# Patient Record
Sex: Male | Born: 1985 | Race: White | Hispanic: No | Marital: Single | Smoking: Never smoker
Health system: Southern US, Community
[De-identification: ages and names within clinical notes are randomized; demographics above are authoritative.]

## PROBLEM LIST (undated history)

## (undated) HISTORY — PX: WRIST FUSION WITH ILIAC CREST BONE GRAFT: SHX5682

---

## 2019-04-17 ENCOUNTER — Other Ambulatory Visit: Payer: Self-pay

## 2019-04-17 DIAGNOSIS — Z20822 Contact with and (suspected) exposure to covid-19: Secondary | ICD-10-CM

## 2019-04-18 LAB — NOVEL CORONAVIRUS, NAA: SARS-CoV-2, NAA: NOT DETECTED

## 2019-07-30 ENCOUNTER — Ambulatory Visit
Admission: EM | Admit: 2019-07-30 | Discharge: 2019-07-30 | Disposition: A | Discharge status: Home / Self Care | Payer: Self-pay | Attending: Emergency Medicine | Admitting: Emergency Medicine

## 2019-07-30 ENCOUNTER — Ambulatory Visit (INDEPENDENT_AMBULATORY_CARE_PROVIDER_SITE_OTHER): Payer: Self-pay

## 2019-07-30 ENCOUNTER — Other Ambulatory Visit: Payer: Self-pay

## 2019-07-30 ENCOUNTER — Encounter: Payer: Self-pay | Admitting: Emergency Medicine

## 2019-07-30 DIAGNOSIS — S51011A Laceration without foreign body of right elbow, initial encounter: Secondary | ICD-10-CM

## 2019-07-30 DIAGNOSIS — Z23 Encounter for immunization: Secondary | ICD-10-CM

## 2019-07-30 DIAGNOSIS — W458XXA Other foreign body or object entering through skin, initial encounter: Secondary | ICD-10-CM

## 2019-07-30 DIAGNOSIS — M25521 Pain in right elbow: Secondary | ICD-10-CM

## 2019-07-30 MED ORDER — TETANUS-DIPHTH-ACELL PERTUSSIS 5-2.5-18.5 LF-MCG/0.5 IM SUSP
0.5000 mL | Freq: Once | INTRAMUSCULAR | Status: AC
  Administered 2019-07-30: 0.5 mL via INTRAMUSCULAR

## 2019-07-30 NOTE — ED Notes (Signed)
Patient able to ambulate independently  
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
## 2019-07-30 NOTE — ED Triage Notes (Addendum)
Pt presents to [facility] for assessment after cutting the back of his right arm (below the elbow) on a shard of broken ceramic.  Last tetanus unknown.  States blood "pours out" if he bends his arm.

## 2019-07-30 NOTE — Discharge Instructions (Addendum)
Wound clean and dry. May change dressings daily. Return to clinic in 3 to 4 days for wound recheck, 10 to 14 days for suture removal. Return sooner for worsening pain, redness, swelling, bleeding, fever.

## 2019-08-03 ENCOUNTER — Ambulatory Visit
Admission: EM | Admit: 2019-08-03 | Discharge: 2019-08-03 | Disposition: A | Discharge status: Home / Self Care | Payer: Self-pay

## 2019-08-03 ENCOUNTER — Telehealth: Payer: Self-pay | Admitting: Emergency Medicine

## 2019-08-03 NOTE — Telephone Encounter (Signed)
Patient called for update on elbow laceration.  Keeping clean and dry.  Minimal swelling around sutures.  No erythema, warmth, tenderness or discharge.  Denying fevers, painful range of motion.  Return precautions discussed, patient verbalized understanding and is agreeable to plan.

## 2020-08-03 ENCOUNTER — Encounter (HOSPITAL_COMMUNITY): Payer: Self-pay

## 2020-08-03 ENCOUNTER — Ambulatory Visit (HOSPITAL_COMMUNITY)
Admission: EM | Admit: 2020-08-03 | Discharge: 2020-08-03 | Disposition: A | Discharge status: Home / Self Care | Payer: Self-pay | Attending: Emergency Medicine | Admitting: Emergency Medicine

## 2020-08-03 ENCOUNTER — Other Ambulatory Visit: Payer: Self-pay

## 2020-08-03 DIAGNOSIS — J029 Acute pharyngitis, unspecified: Secondary | ICD-10-CM

## 2020-08-03 LAB — POCT RAPID STREP A, ED / UC: Streptococcus, Group A Screen (Direct): NEGATIVE

## 2020-08-03 NOTE — ED Triage Notes (Signed)
Pt reports swollen tonsils, chills and sore throat x 1 day.States when swallowing "feels like a bee sting". Pt reports white patches in the throat since this morning.  Reports he had a negative COVID test yesterday.

## 2020-08-03 NOTE — ED Provider Notes (Signed)
[facility]    CSN: [id number] Arrival date & time: 08/03/20  1010      History   Chief Complaint Chief Complaint  Patient presents with  . Sore Throat    HPI Trayden Brandy is a 35 y.o. male.   Eden Emms presents with complaints of sore throat which started yesterday. Feels fatigued as well, and "out of it."  Mild headache. No gi symptoms. Some post nasal drip which produces cough, otherwise no cough or congestion. No ear pain. No known ill contacts. Negative home covid testing. Noted swelling and white exudate to tonsils today.     ROS per HPI, negative if not otherwise mentioned.      History reviewed. No pertinent past medical history.  There are no problems to display for this patient.   Past Surgical History:  Procedure Laterality Date  . WRIST FUSION WITH ILIAC CREST BONE GRAFT Left        Home Medications    Prior to Admission medications   Not on File    Family History History reviewed. No pertinent family history.  Social History Social History   Tobacco Use  . Smoking status: Never Smoker  . Smokeless tobacco: Never Used  Substance Use Topics  . Alcohol use: Yes    Comment: once a week  . Drug use: Yes    Types: Marijuana     Allergies   Patient has no known allergies.   Review of Systems Review of Systems   Physical Exam Triage Vital Signs ED Triage Vitals  Enc Vitals Group     BP 08/03/20 1032 120/69     Pulse Rate 08/03/20 1032 74     Resp 08/03/20 1032 17     Temp 08/03/20 1032 98.9 F (37.2 C)     Temp Source 08/03/20 1032 Oral     SpO2 08/03/20 1032 96 %     Weight --      Height --      Head Circumference --      Peak Flow --      Pain Score 08/03/20 1030 8     Pain Loc --      Pain Edu? --      Excl. in GC? --    No data found.  Updated Vital Signs BP 120/69 (BP Location: Right Arm)   Pulse 74   Temp 98.9 F (37.2 C) (Oral)   Resp 17   SpO2 96%   Visual Acuity Right Eye Distance:    Left Eye Distance:   Bilateral Distance:    Right Eye Near:   Left Eye Near:    Bilateral Near:     Physical Exam Constitutional:      Appearance: He is well-developed.  HENT:     Mouth/Throat:     Tonsils: No tonsillar exudate. 1+ on the right. 1+ on the left.  Cardiovascular:     Rate and Rhythm: Normal rate.  Pulmonary:     Effort: Pulmonary effort is normal.  Lymphadenopathy:     Cervical: No cervical adenopathy.  Skin:    General: Skin is warm and dry.  Neurological:     Mental Status: He is alert and oriented to person, place, and time.      UC Treatments / Results  Labs (all labs ordered are listed, but only abnormal results are displayed) Labs Reviewed  CULTURE, GROUP A STREP [facility])  POCT RAPID STREP A, ED / UC    EKG  Radiology No results found.  Procedures Procedures (including critical care time)  Medications Ordered in UC Medications - No data to display  Initial Impression / Assessment and Plan / UC Course  I have reviewed the triage vital signs and the nursing notes.  Pertinent labs & imaging results that were available during my care of the patient were reviewed by me and considered in my medical decision making (see chart for details).     Negative rapid strep. Consistent with viral etiology at this time. Supportive cares recommended. Return precautions provided. Patient verbalized understanding and agreeable to plan.   Final Clinical Impressions(s) / UC Diagnoses   Final diagnoses:  Pharyngitis, unspecified etiology     Discharge Instructions     Negative for strep throat here today, I have sent your specimen to be cultured to confirm this.  Consistent with viral source of symptoms, which unfortunately cannot be treated with an antibiotic.  Throat lozenges, gargles, chloraseptic spray, warm teas, popsicles etc to help with throat pain.   Tylenol and/or ibuprofen as needed for pain or fevers.   If symptoms worsen or do not  improve in the next week to return to be seen or to follow up with your PCP.      ED Prescriptions    None     PDMP not reviewed this encounter.   Georgetta Haber, NP 08/03/20 1204

## 2020-08-03 NOTE — Discharge Instructions (Signed)
Negative for strep throat here today, I have sent your specimen to be cultured to confirm this.  Consistent with viral source of symptoms, which unfortunately cannot be treated with an antibiotic.  Throat lozenges, gargles, chloraseptic spray, warm teas, popsicles etc to help with throat pain.   Tylenol and/or ibuprofen as needed for pain or fevers.   If symptoms worsen or do not improve in the next week to return to be seen or to follow up with your PCP.

## 2020-08-05 LAB — CULTURE, GROUP A STREP (THRC)

## 2020-09-28 ENCOUNTER — Ambulatory Visit (HOSPITAL_COMMUNITY)
Admission: EM | Admit: 2020-09-28 | Discharge: 2020-09-29 | Disposition: A | Discharge status: Home / Self Care | Payer: No Payment, Other | Attending: Psychiatry | Admitting: Psychiatry

## 2020-09-28 ENCOUNTER — Other Ambulatory Visit: Payer: Self-pay

## 2020-09-28 DIAGNOSIS — Z20822 Contact with and (suspected) exposure to covid-19: Secondary | ICD-10-CM | POA: Diagnosis not present

## 2020-09-28 DIAGNOSIS — F39 Unspecified mood [affective] disorder: Secondary | ICD-10-CM | POA: Diagnosis not present

## 2020-09-28 LAB — COMPREHENSIVE METABOLIC PANEL
ALT: 24 U/L (ref 0–44)
AST: 17 U/L (ref 15–41)
Albumin: 4.3 g/dL (ref 3.5–5.0)
Alkaline Phosphatase: 46 U/L (ref 38–126)
Anion gap: 8 (ref 5–15)
BUN: 13 mg/dL (ref 6–20)
CO2: 28 mmol/L (ref 22–32)
Calcium: 9.6 mg/dL (ref 8.9–10.3)
Chloride: 102 mmol/L (ref 98–111)
Creatinine, Ser: 0.87 mg/dL (ref 0.61–1.24)
GFR, Estimated: >60 mL/min (ref >60)
Glucose, Bld: 75 mg/dL (ref 70–99)
Potassium: 5 mmol/L (ref 3.5–5.1)
Sodium: 138 mmol/L (ref 135–145)
Total Bilirubin: 0.9 mg/dL (ref 0.3–1.2)
Total Protein: 6.8 g/dL (ref 6.5–8.1)

## 2020-09-28 LAB — CBC WITH DIFFERENTIAL/PLATELET
Abs Immature Granulocytes: 0.02 10*3/uL (ref 0.00–0.07)
Basophils Absolute: 0 10*3/uL (ref 0.0–0.1)
Basophils Relative: 0 %
Eosinophils Absolute: 0.2 10*3/uL (ref 0.0–0.5)
Eosinophils Relative: 3 %
HCT: 44.9 % (ref 39.0–52.0)
Hemoglobin: 15.1 g/dL (ref 13.0–17.0)
Immature Granulocytes: 0 %
Lymphocytes Relative: 27 %
Lymphs Abs: 1.9 10*3/uL (ref 0.7–4.0)
MCH: 30.5 pg (ref 26.0–34.0)
MCHC: 33.6 g/dL (ref 30.0–36.0)
MCV: 90.7 fL (ref 80.0–100.0)
Monocytes Absolute: 0.8 10*3/uL (ref 0.1–1.0)
Monocytes Relative: 11 %
Neutro Abs: 4.3 10*3/uL (ref 1.7–7.7)
Neutrophils Relative %: 59 %
Platelets: 231 10*3/uL (ref 150–400)
RBC: 4.95 MIL/uL (ref 4.22–5.81)
RDW: 13.3 % (ref 11.5–15.5)
WBC: 7.3 10*3/uL (ref 4.0–10.5)
nRBC: 0 % (ref 0.0–0.2)

## 2020-09-28 LAB — POCT URINE DRUG SCREEN - MANUAL ENTRY (I-SCREEN)
POC Amphetamine UR: NOT DETECTED
POC Buprenorphine (BUP): NOT DETECTED
POC Cocaine UR: NOT DETECTED
POC Marijuana UR: POSITIVE — AB
POC Methadone UR: NOT DETECTED
POC Methamphetamine UR: NOT DETECTED
POC Morphine: NOT DETECTED
POC Oxazepam (BZO): NOT DETECTED
POC Oxycodone UR: NOT DETECTED
POC Secobarbital (BAR): NOT DETECTED

## 2020-09-28 LAB — HEMOGLOBIN A1C
Hgb A1c MFr Bld: 5.5 % (ref 4.8–5.6)
Mean Plasma Glucose: 111.15 mg/dL

## 2020-09-28 LAB — RESP PANEL BY RT-PCR (FLU A&B, COVID) ARPGX2
Influenza A by PCR: NEGATIVE
Influenza B by PCR: NEGATIVE
SARS Coronavirus 2 by RT PCR: NEGATIVE

## 2020-09-28 LAB — LIPID PANEL
Cholesterol: 190 mg/dL (ref 0–200)
HDL: 64 mg/dL (ref >40)
LDL Cholesterol: 107 mg/dL — ABNORMAL HIGH (ref 0–99)
Total CHOL/HDL Ratio: 3 RATIO
Triglycerides: 95 mg/dL (ref <150)
VLDL: 19 mg/dL (ref 0–40)

## 2020-09-28 LAB — ETHANOL: Alcohol, Ethyl (B): <10 mg/dL (ref <10)

## 2020-09-28 LAB — TSH: TSH: 1.076 u[IU]/mL (ref 0.350–4.500)

## 2020-09-28 LAB — POC SARS CORONAVIRUS 2 AG -  ED: SARS Coronavirus 2 Ag: NEGATIVE

## 2020-09-28 MED ORDER — MAGNESIUM HYDROXIDE 400 MG/5ML PO SUSP: 30.0000 mL | Freq: Every day | ORAL | Status: DC | PRN

## 2020-09-28 MED ORDER — FLUOXETINE HCL 20 MG PO CAPS
20.0000 mg | ORAL_CAPSULE | Freq: Every day | ORAL | Status: DC
  Administered 2020-09-28: 20 mg via ORAL
  Filled 2020-09-28: qty 7
  Filled 2020-09-28: qty 1

## 2020-09-28 MED ORDER — HYDROXYZINE HCL 25 MG PO TABS
25.0000 mg | ORAL_TABLET | Freq: Three times a day (TID) | ORAL | Status: DC | PRN
  Filled 2020-09-28: qty 15

## 2020-09-28 MED ORDER — ACETAMINOPHEN 325 MG PO TABS: 650.0000 mg | ORAL_TABLET | Freq: Four times a day (QID) | ORAL | Status: DC | PRN

## 2020-09-28 MED ORDER — ALUM & MAG HYDROXIDE-SIMETH 200-200-20 MG/5ML PO SUSP: 30.0000 mL | ORAL | Status: DC | PRN

## 2020-09-28 MED ORDER — OLANZAPINE 5 MG PO TABS
5.0000 mg | ORAL_TABLET | Freq: Two times a day (BID) | ORAL | Status: DC
  Administered 2020-09-28 (×2): 5 mg via ORAL
  Filled 2020-09-28: qty 14
  Filled 2020-09-28 (×2): qty 1

## 2020-09-28 MED ORDER — TRAZODONE HCL 50 MG PO TABS
50.0000 mg | ORAL_TABLET | Freq: Every evening | ORAL | Status: DC | PRN
  Administered 2020-09-28: 50 mg via ORAL
  Filled 2020-09-28: qty 1

## 2020-09-28 NOTE — ED Notes (Signed)
Patient sleeping throughout evening.  Up for late dinner and to take HS medications.  Patient denied SI, HI, AVH.

## 2020-09-28 NOTE — Progress Notes (Signed)
Pt is admitted to OBS bed 3 due to SI and depression. Pt is alert and oriented with flat affect. Pt is ambulatory and is oriented to staff and unit. Was cooperative with skin assessment. Pt denies pain, SI/HI/AVH at this time. Staff will monitor pt for safety.
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
## 2020-09-28 NOTE — ED Notes (Signed)
Patient appears to be sleeping.  RR even and unlabored. 

## 2020-09-28 NOTE — Progress Notes (Signed)
Nicholas Benton continued to sleep throughout the day, no noted distress.
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
## 2020-09-29 ENCOUNTER — Ambulatory Visit (INDEPENDENT_AMBULATORY_CARE_PROVIDER_SITE_OTHER): Payer: No Payment, Other | Admitting: Physician Assistant

## 2020-09-29 ENCOUNTER — Encounter (HOSPITAL_COMMUNITY): Payer: Self-pay | Admitting: Physician Assistant

## 2020-09-29 VITALS — BP 123/70 | HR 60 | Ht 68.0 in | Wt 166.0 lb

## 2020-09-29 DIAGNOSIS — F5105 Insomnia due to other mental disorder: Secondary | ICD-10-CM | POA: Diagnosis not present

## 2020-09-29 DIAGNOSIS — F331 Major depressive disorder, recurrent, moderate: Secondary | ICD-10-CM | POA: Diagnosis not present

## 2020-09-29 DIAGNOSIS — F411 Generalized anxiety disorder: Secondary | ICD-10-CM | POA: Diagnosis not present

## 2020-09-29 DIAGNOSIS — F39 Unspecified mood [affective] disorder: Secondary | ICD-10-CM

## 2020-09-29 DIAGNOSIS — F99 Mental disorder, not otherwise specified: Secondary | ICD-10-CM

## 2020-09-29 MED ORDER — OLANZAPINE 5 MG PO TABS
5.0000 mg | ORAL_TABLET | Freq: Two times a day (BID) | ORAL | 1 refills | Status: DC
Start: 2020-09-29 — End: 2020-12-08

## 2020-09-29 MED ORDER — FLUOXETINE HCL 20 MG PO CAPS: 20.0000 mg | ORAL_CAPSULE | Freq: Every day | ORAL | 0 refills | Status: DC

## 2020-09-29 MED ORDER — HYDROXYZINE HCL 25 MG PO TABS: 25.0000 mg | ORAL_TABLET | Freq: Three times a day (TID) | ORAL | 1 refills | Status: DC | PRN

## 2020-09-29 MED ORDER — FLUOXETINE HCL 20 MG PO CAPS: 20.0000 mg | ORAL_CAPSULE | Freq: Every day | ORAL | 1 refills | Status: DC

## 2020-09-29 MED ORDER — OLANZAPINE 5 MG PO TABS: 5.0000 mg | ORAL_TABLET | Freq: Two times a day (BID) | ORAL | 0 refills | Status: DC

## 2020-09-29 MED ORDER — HYDROXYZINE HCL 25 MG PO TABS: 25.0000 mg | ORAL_TABLET | Freq: Three times a day (TID) | ORAL | 0 refills | Status: DC | PRN
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
## 2020-09-29 NOTE — Discharge Instructions (Addendum)

## 2020-09-29 NOTE — Progress Notes (Signed)
Pt alert and oriented on the unit. Education, support, and encouragement provided. Discharge summary, medications and follow up appointments reviewed with pt. Suicide prevention resources provided. Pt's belongings in locker returned and belongings sheet signed. Pt denies SI/HI, A/VH, pain, or any concerns at this time. Pt ambulatory on and off unit. Pt discharged to lobby. 
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
## 2020-09-29 NOTE — ED Notes (Signed)
GIVEN BREAKFAST  

## 2020-09-29 NOTE — ED Notes (Signed)
Pt discharged in no acute distress.

## 2020-09-29 NOTE — ED Notes (Signed)
Pt is currently sleeping, breathing unlabored, environment secured, will continue to monitor patient 

## 2020-10-03 ENCOUNTER — Telehealth (HOSPITAL_COMMUNITY): Payer: Self-pay | Admitting: Physician Assistant

## 2020-10-03 NOTE — Telephone Encounter (Signed)
PT called requesting note for work stating patient is able to return to work with no restrictions.

## 2020-11-10 ENCOUNTER — Encounter (HOSPITAL_COMMUNITY): Payer: Self-pay | Admitting: Physician Assistant

## 2020-11-10 ENCOUNTER — Other Ambulatory Visit: Payer: Self-pay

## 2020-11-10 ENCOUNTER — Ambulatory Visit (INDEPENDENT_AMBULATORY_CARE_PROVIDER_SITE_OTHER): Payer: No Payment, Other | Admitting: Physician Assistant

## 2020-11-10 VITALS — BP 141/84 | HR 68 | Ht 69.0 in | Wt 164.0 lb

## 2020-11-10 DIAGNOSIS — F331 Major depressive disorder, recurrent, moderate: Secondary | ICD-10-CM | POA: Diagnosis not present

## 2020-11-10 DIAGNOSIS — F39 Unspecified mood [affective] disorder: Secondary | ICD-10-CM

## 2020-11-10 DIAGNOSIS — F411 Generalized anxiety disorder: Secondary | ICD-10-CM

## 2020-11-14 ENCOUNTER — Encounter (HOSPITAL_COMMUNITY): Payer: Self-pay | Admitting: Physician Assistant
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
## 2020-11-22 ENCOUNTER — Ambulatory Visit (HOSPITAL_COMMUNITY): Payer: No Payment, Other | Admitting: Licensed Clinical Social Worker

## 2020-12-08 ENCOUNTER — Other Ambulatory Visit (HOSPITAL_COMMUNITY): Payer: Self-pay | Admitting: Physician Assistant

## 2020-12-08 ENCOUNTER — Telehealth (HOSPITAL_COMMUNITY): Payer: Self-pay | Admitting: Physician Assistant

## 2020-12-08 DIAGNOSIS — F39 Unspecified mood [affective] disorder: Secondary | ICD-10-CM

## 2020-12-08 DIAGNOSIS — F411 Generalized anxiety disorder: Secondary | ICD-10-CM

## 2020-12-08 DIAGNOSIS — F331 Major depressive disorder, recurrent, moderate: Secondary | ICD-10-CM

## 2020-12-08 DIAGNOSIS — F99 Mental disorder, not otherwise specified: Secondary | ICD-10-CM

## 2020-12-08 DIAGNOSIS — F5105 Insomnia due to other mental disorder: Secondary | ICD-10-CM

## 2020-12-08 MED ORDER — HYDROXYZINE HCL 25 MG PO TABS: 25.0000 mg | ORAL_TABLET | Freq: Three times a day (TID) | ORAL | 1 refills | Status: AC | PRN

## 2020-12-08 MED ORDER — FLUOXETINE HCL 20 MG PO CAPS: 20.0000 mg | ORAL_CAPSULE | Freq: Every day | ORAL | 1 refills | Status: AC

## 2020-12-08 MED ORDER — OLANZAPINE 5 MG PO TABS: 5.0000 mg | ORAL_TABLET | Freq: Two times a day (BID) | ORAL | 1 refills | Status: AC

## 2020-12-08 NOTE — Progress Notes (Signed)
Patient's medication to be e-prescribed to pharmacy of choice. 

## 2020-12-08 NOTE — Telephone Encounter (Signed)
Provider was contacted by Earnestine Mealing regarding medication refill. It should be noted that patient refused medication management during his last encounter and instead was only interested in receiving counseling/legal advice. Patient's medication to be e-prescribed to pharmacy of choice.

## 2020-12-14 ENCOUNTER — Encounter (HOSPITAL_COMMUNITY): Payer: No Payment, Other | Admitting: Physician Assistant

## 2021-01-31 ENCOUNTER — Encounter (HOSPITAL_COMMUNITY): Payer: No Payment, Other | Admitting: Physician Assistant

## 2021-02-15 IMAGING — DX DG ELBOW COMPLETE 3+V*R*
4 series · 4 of 4 positions shown · non-contrast
Comparison: None.

CLINICAL DATA: Right elbow laceration and pain after injury while
wiping down patio furniture. Cut arm on broken piece of ceramic.

EXAM:
RIGHT ELBOW - COMPLETE 3+ VIEW

[elbow ap (1 of 3)]
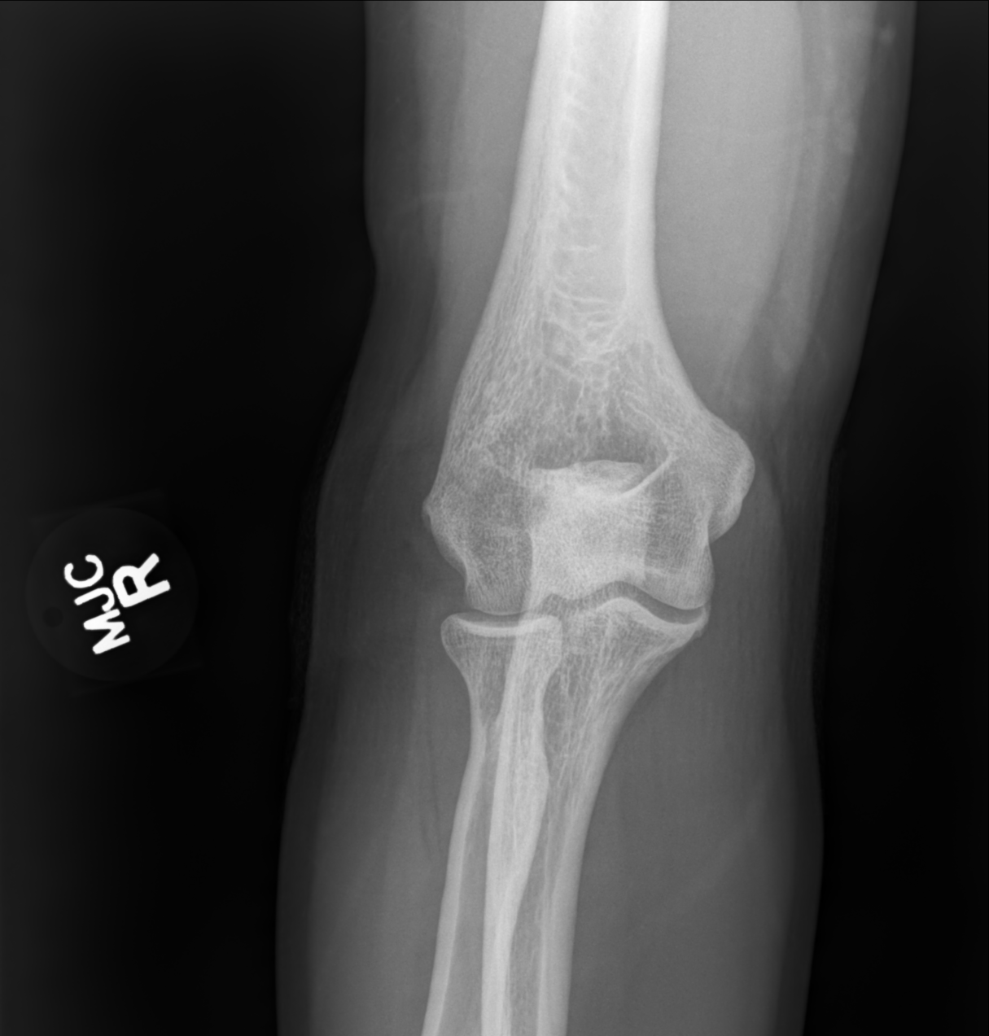

[elbow ap (2 of 3)]
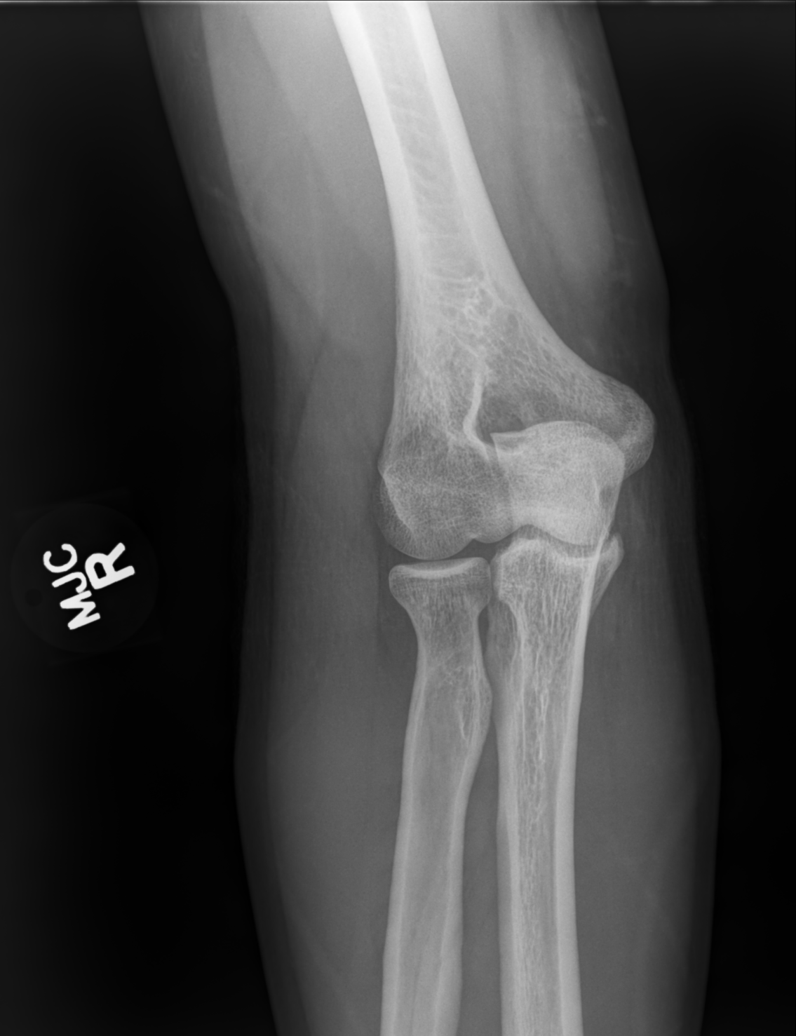

[elbow ap (3 of 3)]
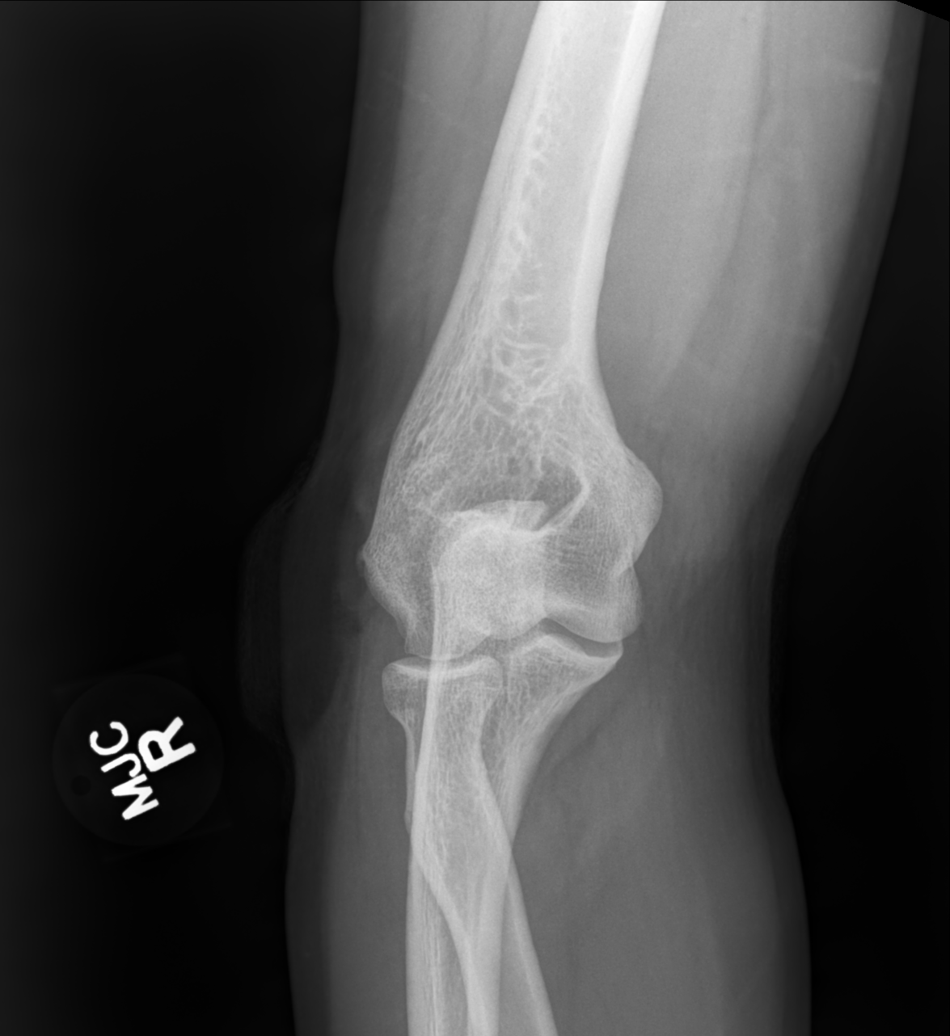

[elbow lat]
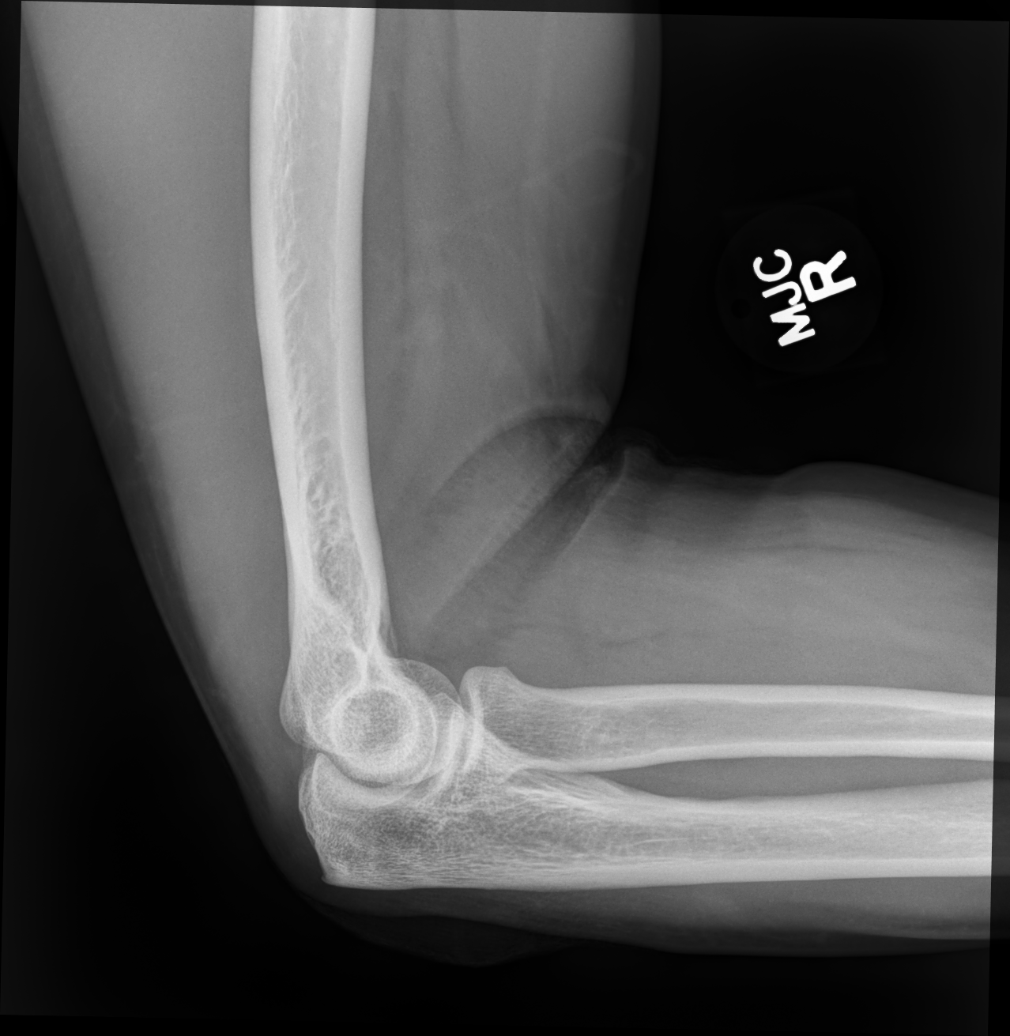

[4 of 4 positions shown; findings below may reference images not displayed]

FINDINGS: There is no evidence of fracture, dislocation, or joint effusion.
There is no evidence of arthropathy or other focal bone abnormality.
No radiopaque foreign body. A dressing overlies the elbow. Suspected
soft tissue defect/laceration about the lateral aspect. No tracking
soft tissue air.
IMPRESSION: No radiopaque foreign body or acute osseous abnormality.
